# Patient Record
Sex: Male | Born: 1980 | Race: White | Hispanic: Yes | Smoking: Light tobacco smoker
Health system: Southern US, Community
[De-identification: ages and names within clinical notes are randomized; demographics above are authoritative.]

---

## 2017-07-20 ENCOUNTER — Ambulatory Visit (INDEPENDENT_AMBULATORY_CARE_PROVIDER_SITE_OTHER): Payer: 59 | Admitting: Psychology

## 2017-07-20 DIAGNOSIS — F4481 Dissociative identity disorder: Secondary | ICD-10-CM

## 2017-08-07 ENCOUNTER — Ambulatory Visit (INDEPENDENT_AMBULATORY_CARE_PROVIDER_SITE_OTHER): Payer: 59 | Admitting: Psychology

## 2017-08-07 DIAGNOSIS — F449 Dissociative and conversion disorder, unspecified: Secondary | ICD-10-CM | POA: Diagnosis not present

## 2017-09-04 ENCOUNTER — Ambulatory Visit: Payer: 59 | Admitting: Psychology

## 2017-09-09 ENCOUNTER — Ambulatory Visit (INDEPENDENT_AMBULATORY_CARE_PROVIDER_SITE_OTHER): Payer: 59 | Admitting: Psychology

## 2017-09-09 DIAGNOSIS — F449 Dissociative and conversion disorder, unspecified: Secondary | ICD-10-CM

## 2017-09-22 ENCOUNTER — Ambulatory Visit: Payer: Self-pay | Admitting: Psychology

## 2020-03-22 ENCOUNTER — Other Ambulatory Visit: Payer: Self-pay

## 2020-03-22 ENCOUNTER — Emergency Department (HOSPITAL_BASED_OUTPATIENT_CLINIC_OR_DEPARTMENT_OTHER)
Admission: EM | Admit: 2020-03-22 | Discharge: 2020-03-22 | Disposition: A | Discharge status: Home / Self Care | Payer: 59 | Attending: Emergency Medicine | Admitting: Emergency Medicine

## 2020-03-22 ENCOUNTER — Encounter (HOSPITAL_BASED_OUTPATIENT_CLINIC_OR_DEPARTMENT_OTHER): Payer: Self-pay | Admitting: Emergency Medicine

## 2020-03-22 DIAGNOSIS — Z720 Tobacco use: Secondary | ICD-10-CM | POA: Diagnosis not present

## 2020-03-22 DIAGNOSIS — Y990 Civilian activity done for income or pay: Secondary | ICD-10-CM | POA: Insufficient documentation

## 2020-03-22 DIAGNOSIS — S0502XA Injury of conjunctiva and corneal abrasion without foreign body, left eye, initial encounter: Secondary | ICD-10-CM | POA: Insufficient documentation

## 2020-03-22 DIAGNOSIS — Y929 Unspecified place or not applicable: Secondary | ICD-10-CM | POA: Diagnosis not present

## 2020-03-22 DIAGNOSIS — X58XXXA Exposure to other specified factors, initial encounter: Secondary | ICD-10-CM | POA: Diagnosis not present

## 2020-03-22 DIAGNOSIS — Y939 Activity, unspecified: Secondary | ICD-10-CM | POA: Diagnosis not present

## 2020-03-22 DIAGNOSIS — S0592XA Unspecified injury of left eye and orbit, initial encounter: Secondary | ICD-10-CM | POA: Diagnosis present

## 2020-03-22 MED ORDER — CIPROFLOXACIN HCL 0.3 % OP SOLN
2.0000 [drp] | OPHTHALMIC | 0 refills | Status: DC
Start: 2020-03-22 — End: 2020-06-07

## 2020-03-22 MED ORDER — TETRACAINE HCL 0.5 % OP SOLN
2.0000 [drp] | Freq: Once | OPHTHALMIC | Status: AC
  Administered 2020-03-22: 2 [drp] via OPHTHALMIC

## 2020-03-22 MED ORDER — FLUORESCEIN SODIUM 1 MG OP STRP
ORAL_STRIP | OPHTHALMIC | Status: AC
  Administered 2020-03-22: 1 via OPHTHALMIC
  Filled 2020-03-22: qty 1

## 2020-03-22 MED ORDER — TETANUS-DIPHTH-ACELL PERTUSSIS 5-2.5-18.5 LF-MCG/0.5 IM SUSP
INTRAMUSCULAR | Status: AC
  Filled 2020-03-22: qty 0.5

## 2020-03-22 MED ORDER — TETRACAINE HCL 0.5 % OP SOLN
OPHTHALMIC | Status: AC
  Filled 2020-03-22: qty 4

## 2020-03-22 MED ORDER — TETANUS-DIPHTH-ACELL PERTUSSIS 5-2.5-18.5 LF-MCG/0.5 IM SUSP
0.5000 mL | Freq: Once | INTRAMUSCULAR | Status: AC
  Administered 2020-03-22: 0.5 mL via INTRAMUSCULAR

## 2020-03-22 MED ORDER — FLUORESCEIN SODIUM 1 MG OP STRP: 1.0000 | ORAL_STRIP | Freq: Once | OPHTHALMIC | Status: AC

## 2020-03-22 NOTE — ED Provider Notes (Signed)
[facility] EMERGENCY DEPARTMENT Provider Note   CSN: [id number] Arrival date & time: 03/22/20  0137     History Chief Complaint  Patient presents with  . Eye Pain    Gregory Carrillo is a 39 y.o. male.  The history is provided by the patient.  Eye Pain This is a new problem. The current episode started 12 to 24 hours ago. The problem occurs constantly. The problem has not changed since onset.Pertinent negatives include no chest pain, no abdominal pain and no shortness of breath. Nothing aggravates the symptoms. Nothing relieves the symptoms. He has tried nothing for the symptoms. The treatment provided no relief.  Felt like something flew in the L eye 24 hours ago at work.  Now red and irritated.       History reviewed. No pertinent past medical history.  There are no problems to display for this patient.   History reviewed. No pertinent surgical history.     History reviewed. No pertinent family history.  Social History   Tobacco Use  . Smoking status: Light Tobacco Smoker  . Smokeless tobacco: Current User    Types: Chew  Substance Use Topics  . Alcohol use: Not Currently  . Drug use: Not Currently    Home Medications Prior to Admission medications   Not on File    Allergies    Patient has no known allergies.  Review of Systems   Review of Systems  Constitutional: Negative for fever.  HENT: Negative for congestion.   Eyes: Positive for pain and redness. Negative for visual disturbance.  Respiratory: Negative for shortness of breath.   Cardiovascular: Negative for chest pain.  Gastrointestinal: Negative for abdominal pain.  Genitourinary: Negative for difficulty urinating.  Musculoskeletal: Negative for arthralgias.  Neurological: Negative for dizziness.  Psychiatric/Behavioral: Negative for agitation.  All other systems reviewed and are negative.   Physical Exam Updated Vital Signs BP (!) 147/101 (BP Location: Left Arm)   Pulse 81    Temp 98.1 F (36.7 C) (Oral)   Resp 18   Ht 5\' 11"  (1.803 m)   Wt 95.3 kg   SpO2 99%   BMI 29.29 kg/m   Physical Exam Vitals and nursing note reviewed.  Constitutional:      General: He is not in acute distress.    Appearance: Normal appearance.  HENT:     Head: Normocephalic and atraumatic.     Nose: Nose normal.  Eyes:     General: Lids are normal. Lids are everted, no foreign bodies appreciated.     Extraocular Movements: Extraocular movements intact.     Conjunctiva/sclera:     Left eye: Left conjunctiva is injected.     Pupils: Pupils are equal, round, and reactive to light.   Cardiovascular:     Rate and Rhythm: Normal rate and regular rhythm.     Pulses: Normal pulses.     Heart sounds: Normal heart sounds.  Pulmonary:     Effort: Pulmonary effort is normal.     Breath sounds: Normal breath sounds.  Abdominal:     General: Abdomen is flat. Bowel sounds are normal.     Tenderness: There is no abdominal tenderness. There is no guarding.  Musculoskeletal:        General: Normal range of motion.     Cervical back: Normal range of motion and neck supple.  Skin:    General: Skin is warm and dry.     Capillary Refill: Capillary refill takes less than 2  seconds.  Neurological:     General: No focal deficit present.     Mental Status: He is alert and oriented to person, place, and time.     Deep Tendon Reflexes: Reflexes normal.  Psychiatric:        Mood and Affect: Mood normal.        Behavior: Behavior normal.     ED Results / Procedures / Treatments   Labs (all labs ordered are listed, but only abnormal results are displayed) Labs Reviewed - No data to display  EKG None  Radiology No results found.  Procedures Procedures (including critical care time)  Medications Ordered in ED Medications  fluorescein ophthalmic strip 1 strip (has no administration in time range)  tetracaine (PONTOCAINE) 0.5 % ophthalmic solution 2 drop (has no administration in  time range)  Tdap (BOOSTRIX) injection 0.5 mL (has no administration in time range)    ED Course  I have reviewed the triage vital signs and the nursing notes.  Pertinent labs & imaging results that were available during my care of the patient were reviewed by me and considered in my medical decision making (see chart for details).    Patient has a corneal abrasion.  No foreign bodies. Will start ciloxan drops for corneal abrasion and have patient follow up with Ophthalmology in 48 hours.   Final Clinical Impression(s) / ED Diagnoses Return for intractable cough, coughing up blood,fevers >100.4 unrelieved by medication, shortness of breath, intractable vomiting, chest pain, shortness of breath, weakness,numbness, changes in speech, facial asymmetry,abdominal pain, passing out,Inability to tolerate liquids or food, cough, altered mental status or any concerns. No signs of systemic illness or infection. The patient is nontoxic-appearing on exam and vital signs are within normal limits.   I have reviewed the triage vital signs and the nursing notes. Pertinent labs &imaging results that were available during my care of the patient were reviewed by me and considered in my medical decision making (see chart for details).After history, exam, and medical workup I feel the patient has beenappropriately medically screened and is safe for discharge home. Pertinent diagnoses were discussed with the patient. Patient was given return precautions.   Jisselle Poth, MD 03/22/20 [id number]

## 2020-03-22 NOTE — ED Triage Notes (Signed)
Patient presents with complaints of left eye pain and swelling; states woke up yesterday am with left eye pain. Denies any drainage from eye.

## 2020-06-07 ENCOUNTER — Emergency Department (HOSPITAL_BASED_OUTPATIENT_CLINIC_OR_DEPARTMENT_OTHER): Payer: Worker's Compensation

## 2020-06-07 ENCOUNTER — Encounter (HOSPITAL_BASED_OUTPATIENT_CLINIC_OR_DEPARTMENT_OTHER): Payer: Self-pay | Admitting: Emergency Medicine

## 2020-06-07 ENCOUNTER — Other Ambulatory Visit: Payer: Self-pay

## 2020-06-07 ENCOUNTER — Emergency Department (HOSPITAL_BASED_OUTPATIENT_CLINIC_OR_DEPARTMENT_OTHER)
Admission: EM | Admit: 2020-06-07 | Discharge: 2020-06-07 | Disposition: A | Discharge status: Home / Self Care | Payer: Worker's Compensation | Attending: Emergency Medicine | Admitting: Emergency Medicine

## 2020-06-07 DIAGNOSIS — Y9269 Other specified industrial and construction area as the place of occurrence of the external cause: Secondary | ICD-10-CM | POA: Diagnosis not present

## 2020-06-07 DIAGNOSIS — S9782XA Crushing injury of left foot, initial encounter: Secondary | ICD-10-CM | POA: Insufficient documentation

## 2020-06-07 DIAGNOSIS — F17200 Nicotine dependence, unspecified, uncomplicated: Secondary | ICD-10-CM | POA: Diagnosis not present

## 2020-06-07 DIAGNOSIS — Y990 Civilian activity done for income or pay: Secondary | ICD-10-CM | POA: Insufficient documentation

## 2020-06-07 DIAGNOSIS — Y9319 Activity, other involving water and watercraft: Secondary | ICD-10-CM | POA: Insufficient documentation

## 2020-06-07 DIAGNOSIS — S9702XA Crushing injury of left ankle, initial encounter: Secondary | ICD-10-CM | POA: Diagnosis not present

## 2020-06-07 DIAGNOSIS — S99922A Unspecified injury of left foot, initial encounter: Secondary | ICD-10-CM | POA: Diagnosis present

## 2020-06-07 MED ORDER — NAPROXEN 250 MG PO TABS
500.0000 mg | ORAL_TABLET | Freq: Once | ORAL | Status: AC
  Administered 2020-06-07: 500 mg via ORAL
  Filled 2020-06-07: qty 2

## 2020-06-07 NOTE — ED Provider Notes (Signed)
[facility] Provider Note: Lowella Dell, MD, FACEP  CSN: [id number] MRN: [id number] ARRIVAL: 06/07/20 at 0343 ROOM: [facility]06/[facility]   CHIEF COMPLAINT  Foot Injury   HISTORY OF PRESENT ILLNESS  06/07/20 3:54 AM Gregory Carrillo is a 39 y.o. male who had his left foot and ankle run over by a forklift at work about 30 minutes prior to arrival.  He is having pain in his left ankle, primarily in the calcaneus.  He rates his pain is a 5 out of 10, worse with weightbearing.  He states the pain is getting worse.  He is also having ecchymosis of the left ankle and foot.   History reviewed. No pertinent past medical history.  History reviewed. No pertinent surgical history.  No family history on file.  Social History   Tobacco Use  . Smoking status: Light Tobacco Smoker  . Smokeless tobacco: Current User    Types: Chew  Substance Use Topics  . Alcohol use: Not Currently  . Drug use: Not Currently    Prior to Admission medications   Not on File    Allergies Patient has no known allergies.   REVIEW OF SYSTEMS  Negative except as noted here or in the History of Present Illness.   PHYSICAL EXAMINATION  Initial Vital Signs Blood pressure (!) 155/96, pulse 71, temperature 97.8 F (36.6 C), temperature source Oral, resp. rate 16, height 5\' 11"  (1.803 m), weight 88.5 kg, SpO2 100 %.  Examination General: Well-developed, well-nourished male in no acute distress; appearance consistent with age of record HENT: normocephalic; atraumatic Eyes: Normal appearance Neck: supple Heart: regular rate and rhythm Lungs: clear to auscultation bilaterally Abdomen: soft; nondistended; nontender; bowel sounds present Extremities: No deformity; pulses normal; tenderness of left talocalcaneal region with ecchymosis of the left ankle and foot, left foot distally neurovascularly intact:      Neurologic: Awake, alert and oriented; motor function intact in all extremities and symmetric;  no facial droop Skin: Warm and dry Psychiatric: Normal mood and affect   RESULTS  Summary of this visit's results, reviewed and interpreted by myself:   EKG Interpretation  Date/Time:    Ventricular Rate:    PR Interval:    QRS Duration:   QT Interval:    QTC Calculation:   R Axis:     Text Interpretation:        Laboratory Studies: No results found for this or any previous visit (from the past 24 hour(s)). Imaging Studies: DG Ankle Complete Left  Result Date: 06/07/2020 CLINICAL DATA:  Work injury with ankle bruising on the left EXAM: LEFT ANKLE COMPLETE - 3+ VIEW COMPARISON:  None. FINDINGS: There is no evidence of fracture, dislocation, or joint effusion. There is no evidence of arthropathy or other focal bone abnormality. Soft tissues are unremarkable. IMPRESSION: Negative. Electronically Signed   By: 06/09/2020 M.D.   On: 06/07/2020 04:12   DG Foot Complete Left  Result Date: 06/07/2020 CLINICAL DATA:  Crush injury to the foot. EXAM: LEFT FOOT - COMPLETE 3+ VIEW COMPARISON:  None. FINDINGS: There is no evidence of fracture or dislocation. Fragmented appearance of the lateral great toe sesamoid but corticated appearing. There is likely superimposed spurring. IMPRESSION: No acute fracture. Electronically Signed   By: 06/09/2020 M.D.   On: 06/07/2020 04:14    ED COURSE and MDM  Nursing notes, initial and subsequent vitals signs, including pulse oximetry, reviewed and interpreted by myself.  Vitals:   06/07/20 0350  BP: (!) 155/96  Pulse: 71  Resp: 16  Temp: 97.8 F (36.6 C)  TempSrc: Oral  SpO2: 100%  Weight: 88.5 kg  Height: 5\' 11"  (1.803 m)   Medications  naproxen (NAPROSYN) tablet 500 mg (has no administration in time range)    No evidence of fracture or compartment syndrome on radiograph or exam, respectively.  Patient was advised to take over-the-counter naproxen or ibuprofen as needed for pain and to return for worsening symptoms such as severe  pain, pallor or paresthesias.  PROCEDURES  Procedures   ED DIAGNOSES     ICD-10-CM   1. Crushing injury of left foot and ankle, initial encounter  S97.82XA    S97.02XA        Aubreana Cornacchia, , MD 06/07/20 [phone]

## 2020-06-07 NOTE — ED Triage Notes (Signed)
Pt reports fork lift ran over left ankle while at work. Pt has bruising and swelling to left ankle.

## 2021-10-13 IMAGING — DX DG FOOT COMPLETE 3+V*L*
3 series · 3 of 3 positions shown · non-contrast
Comparison: None.

CLINICAL DATA: Crush injury to the foot.

EXAM:
LEFT FOOT - COMPLETE 3+ VIEW

[foot ap]
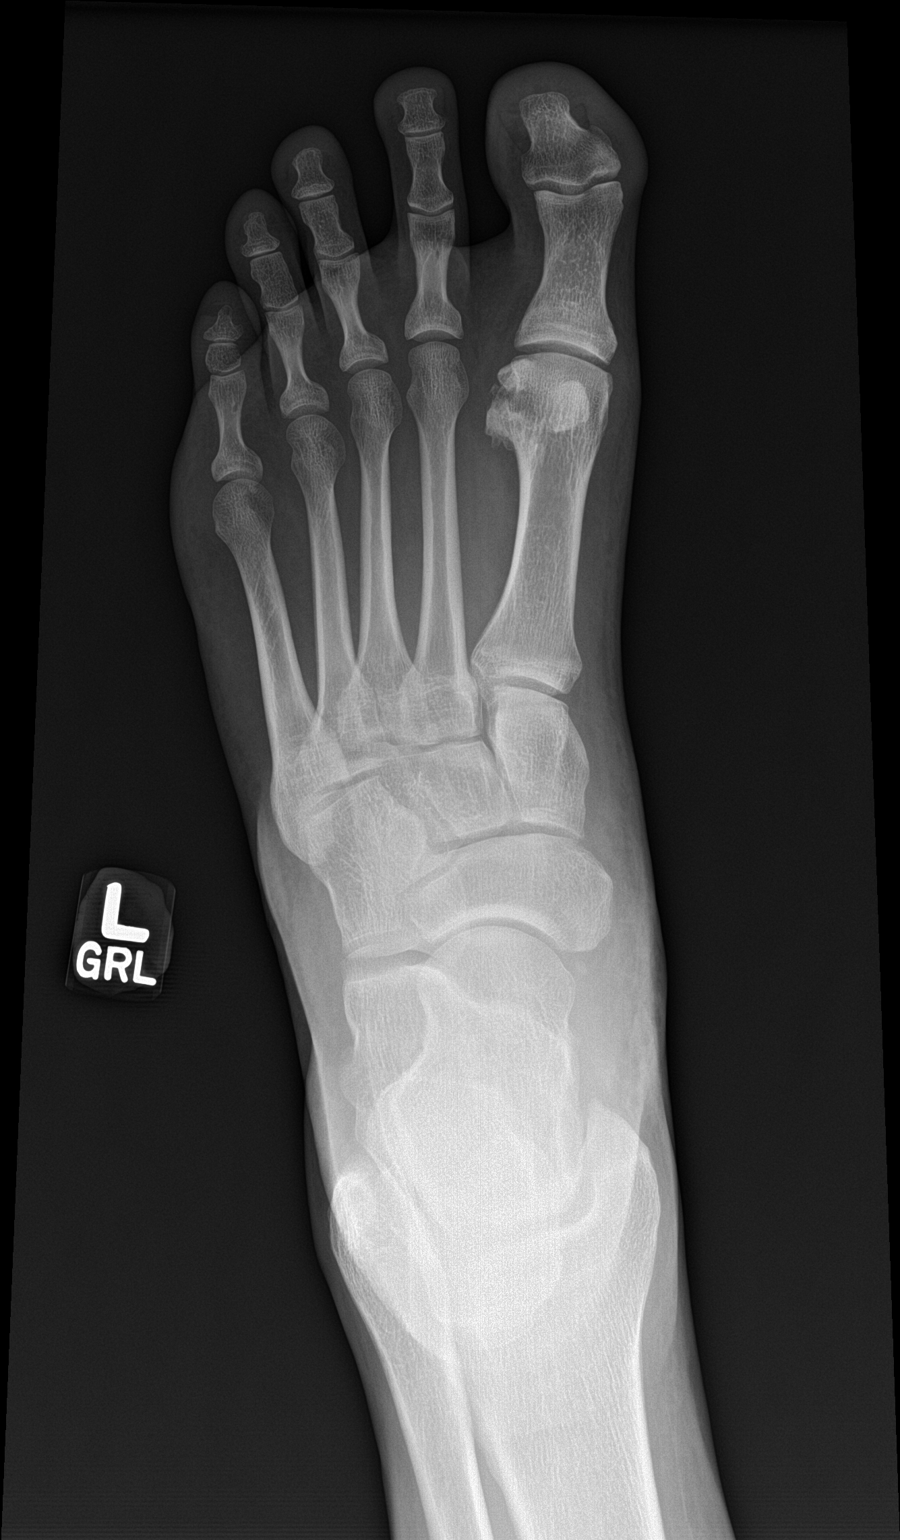

[foot obl]
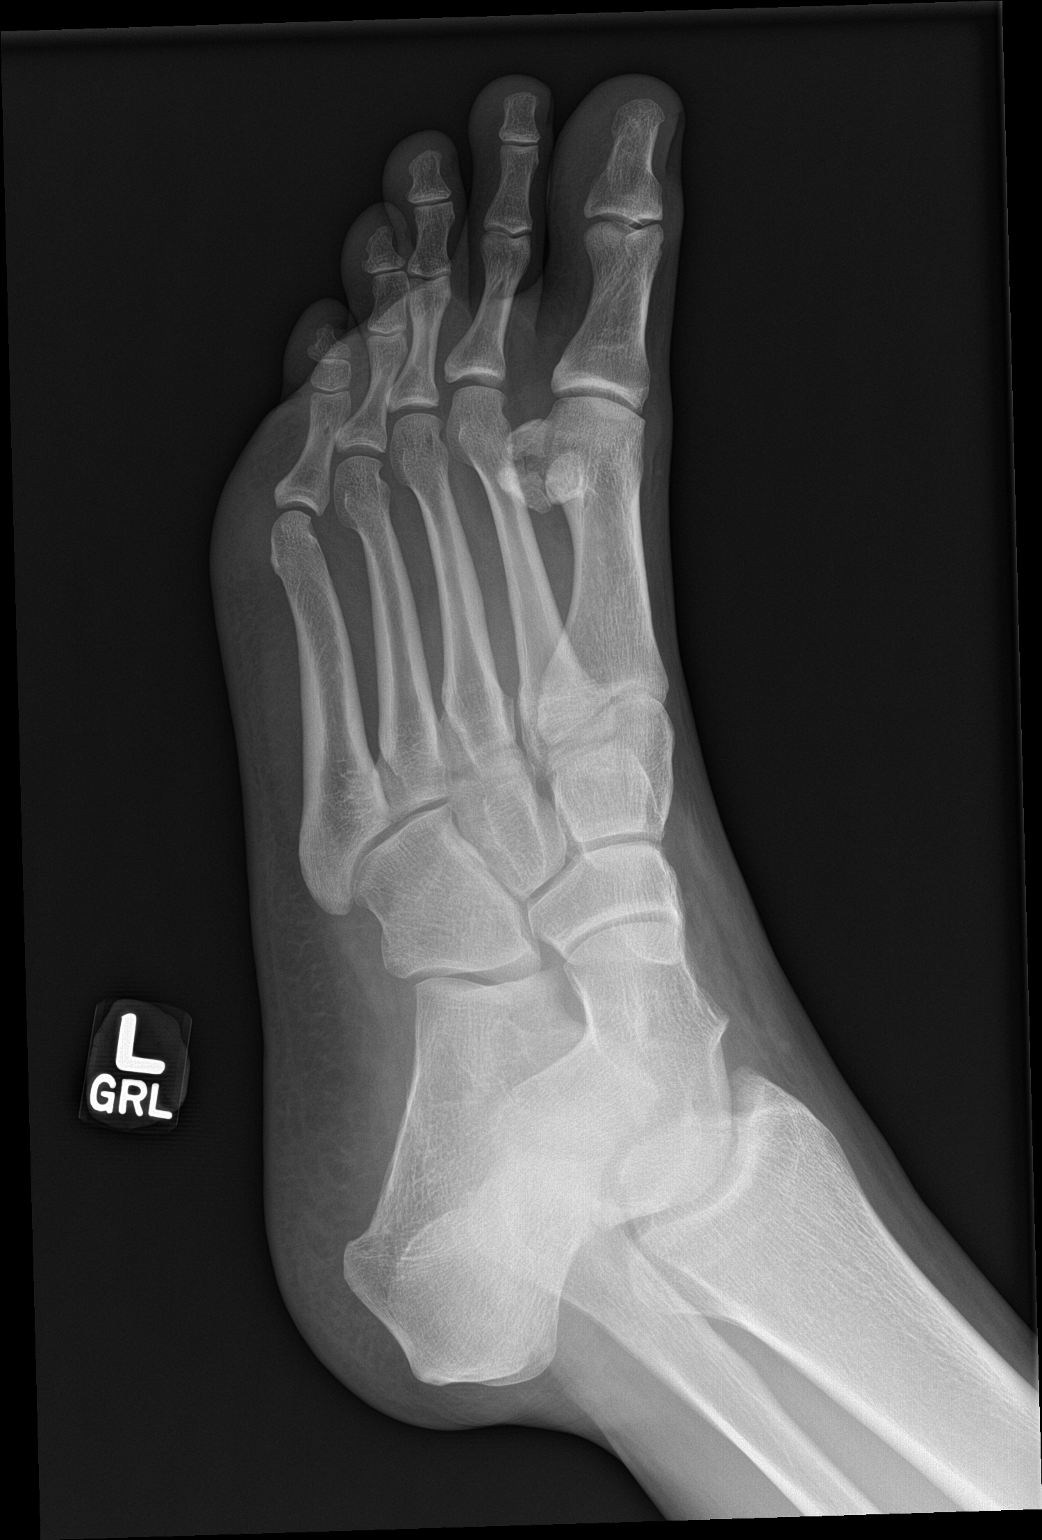

[foot lat]
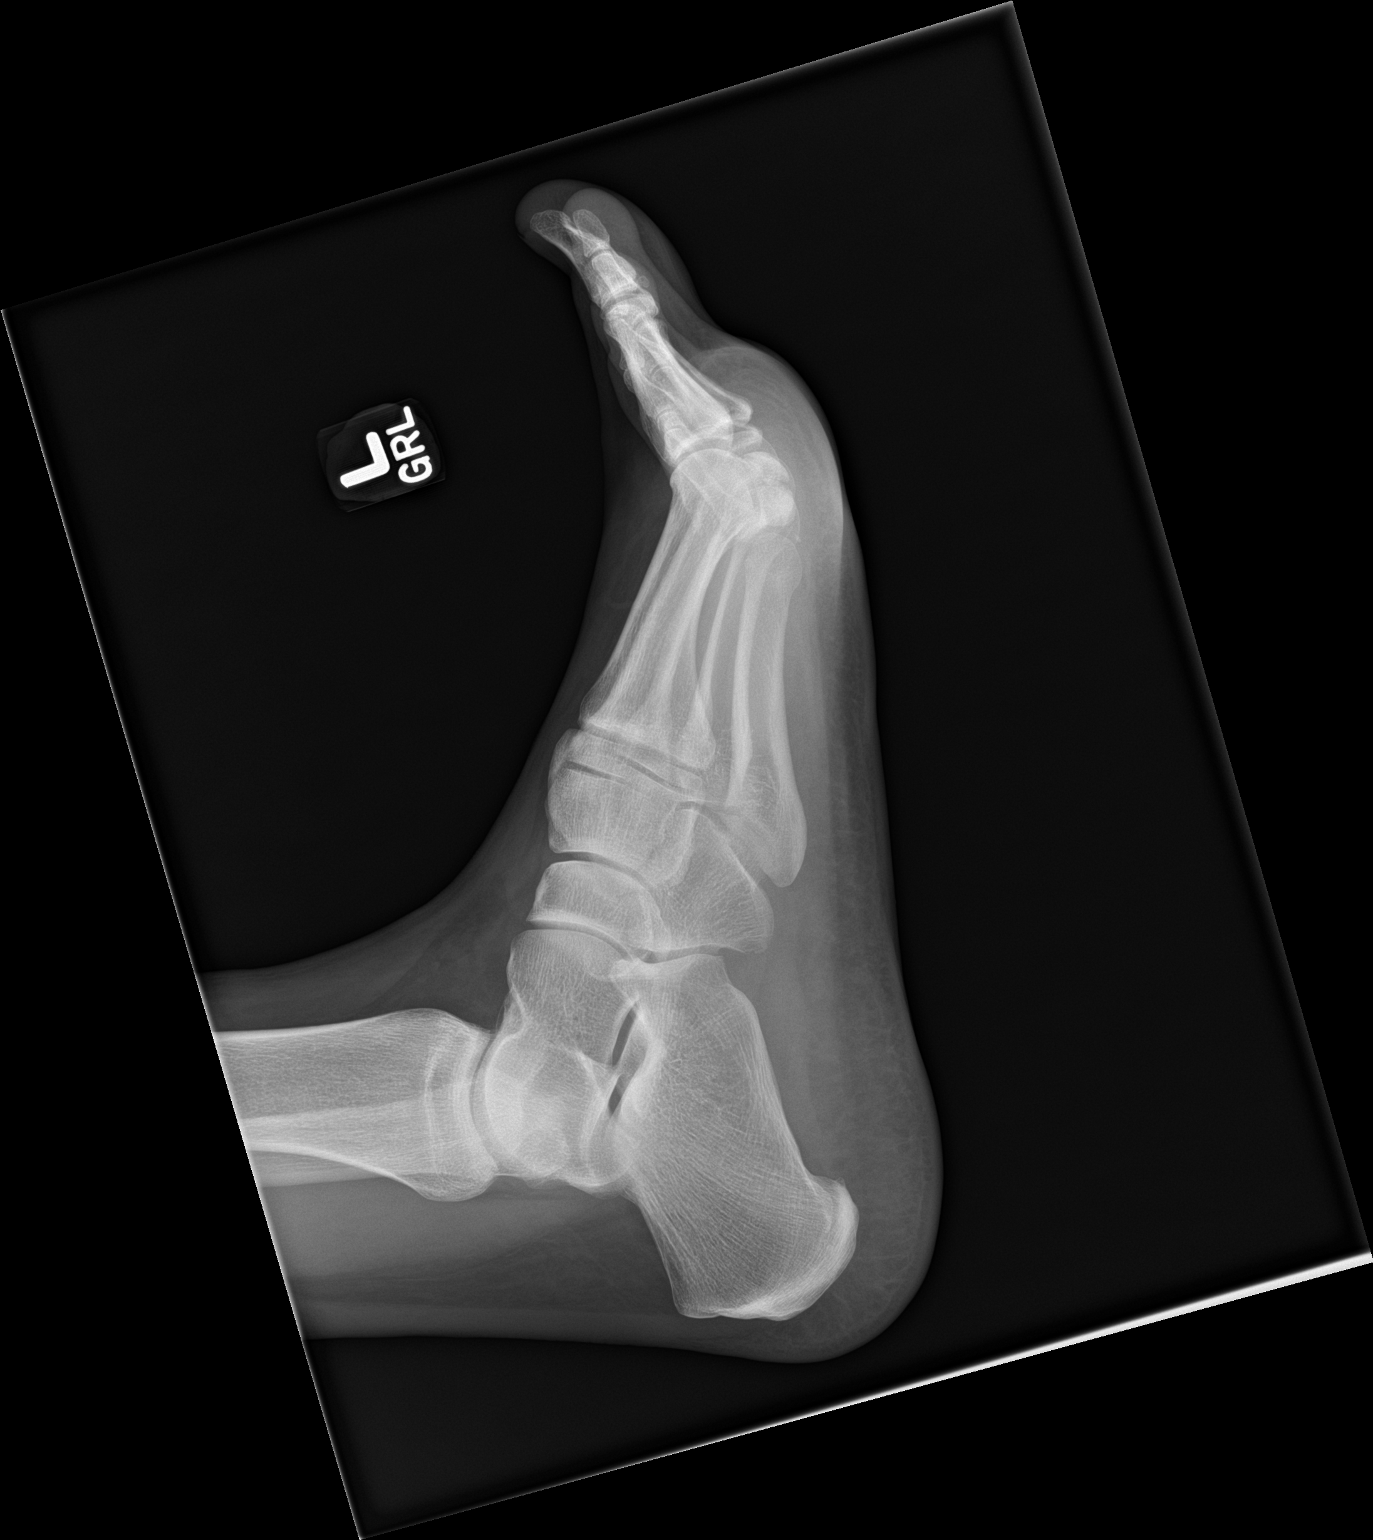

[3 of 3 positions shown; findings below may reference images not displayed]

FINDINGS: There is no evidence of fracture or dislocation.

Fragmented appearance of the lateral great toe sesamoid but
corticated appearing. There is likely superimposed spurring.
IMPRESSION: No acute fracture.

## 2021-10-13 IMAGING — DX DG ANKLE COMPLETE 3+V*L*
3 series · 3 of 3 positions shown · non-contrast
Comparison: None.

CLINICAL DATA: Work injury with ankle bruising on the left

EXAM:
LEFT ANKLE COMPLETE - 3+ VIEW

[ankle ap]
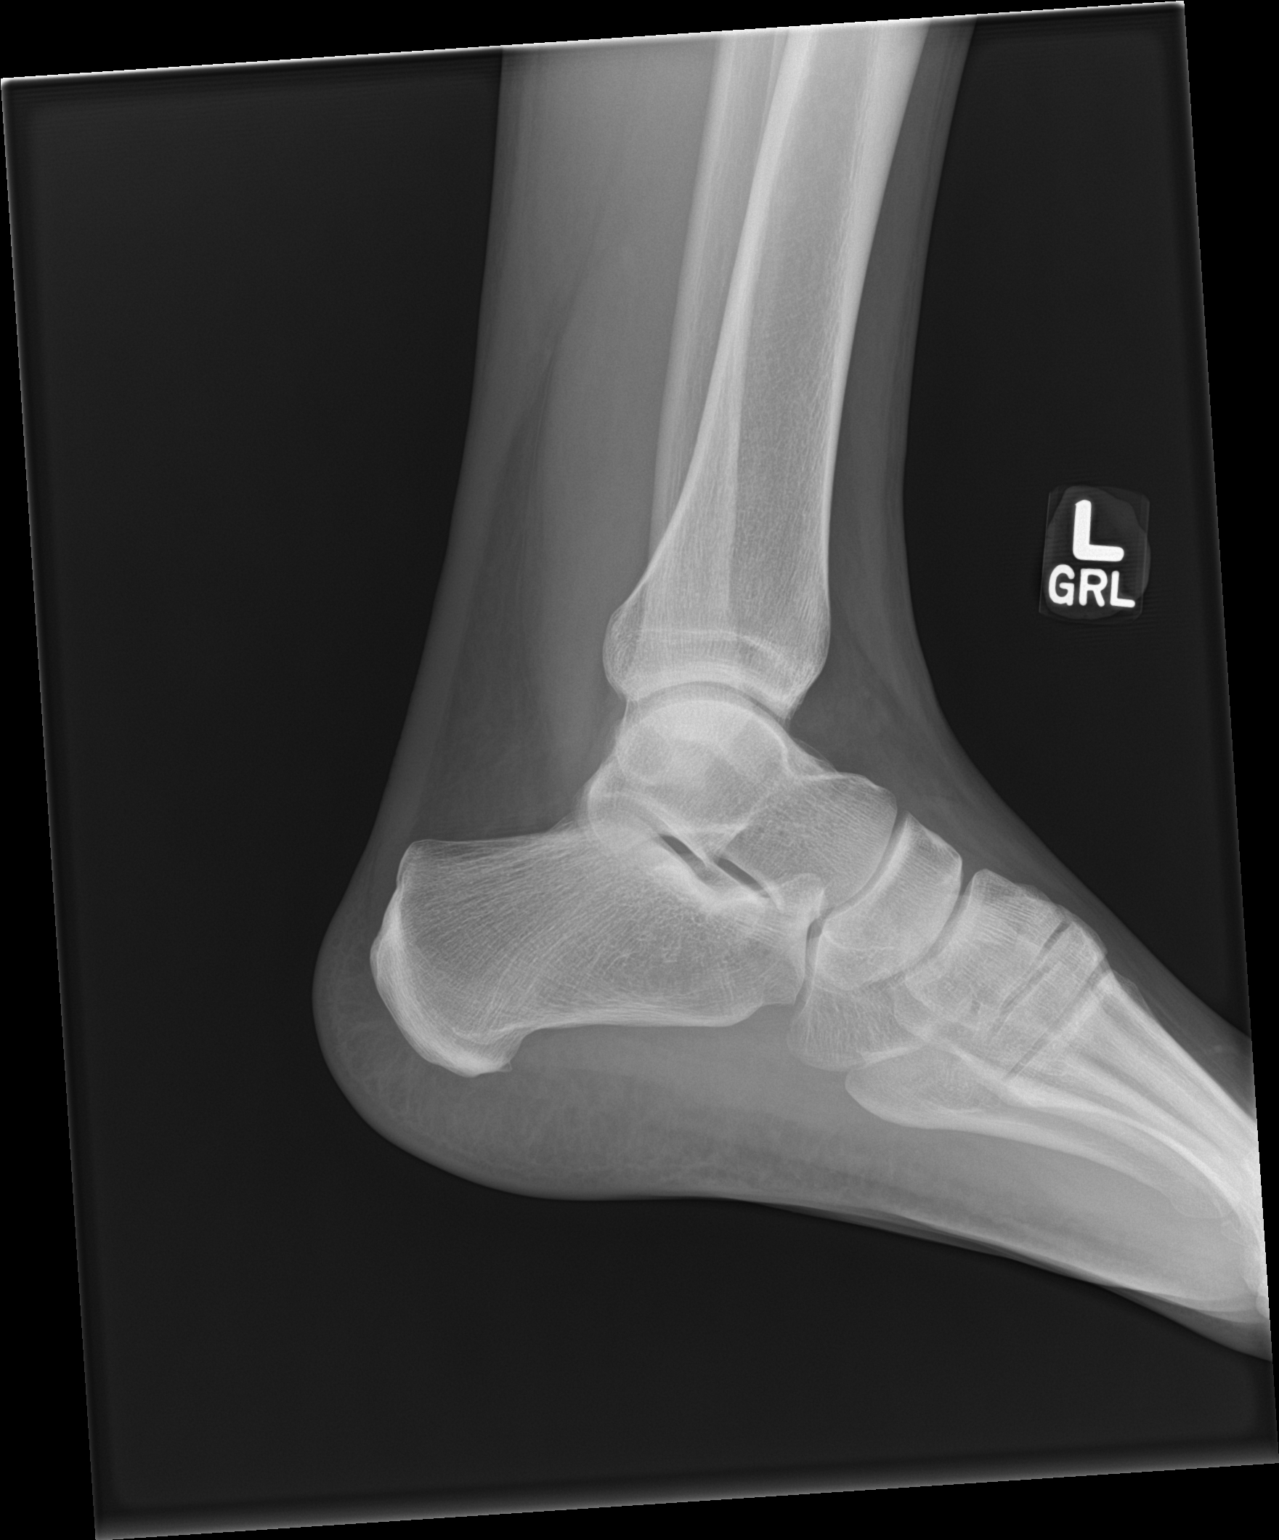

[ankle obl]
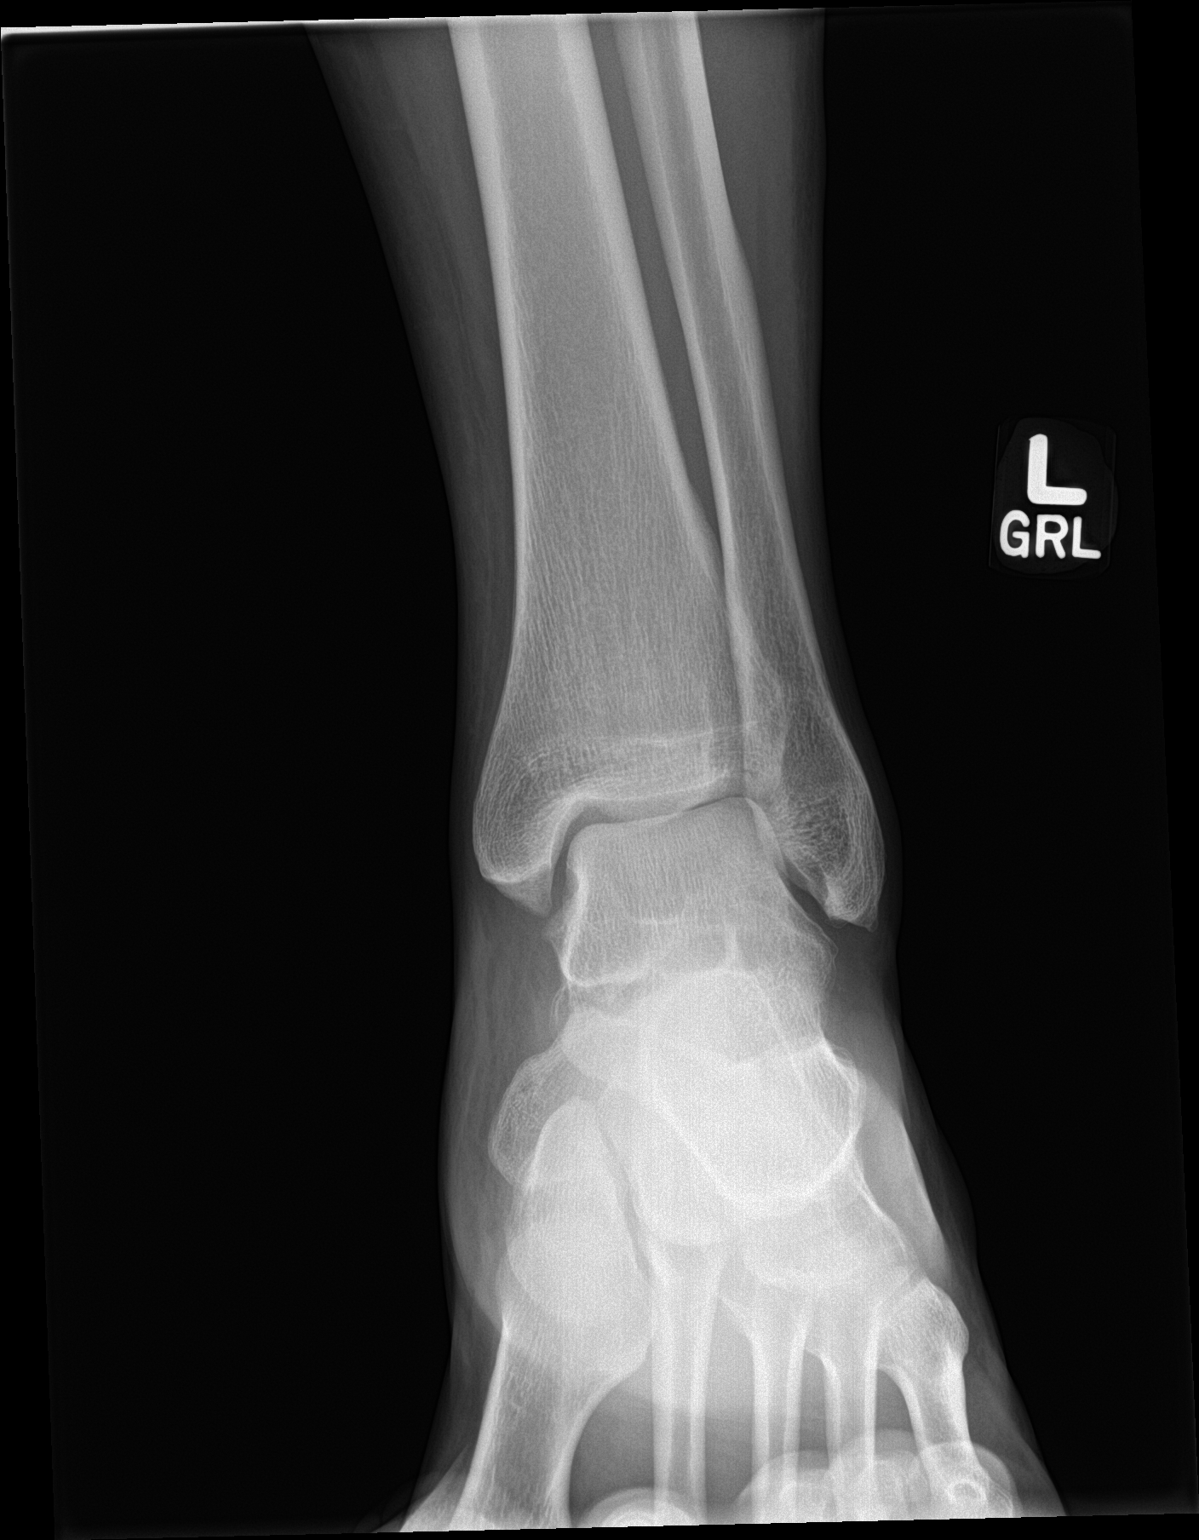

[ankle lat]
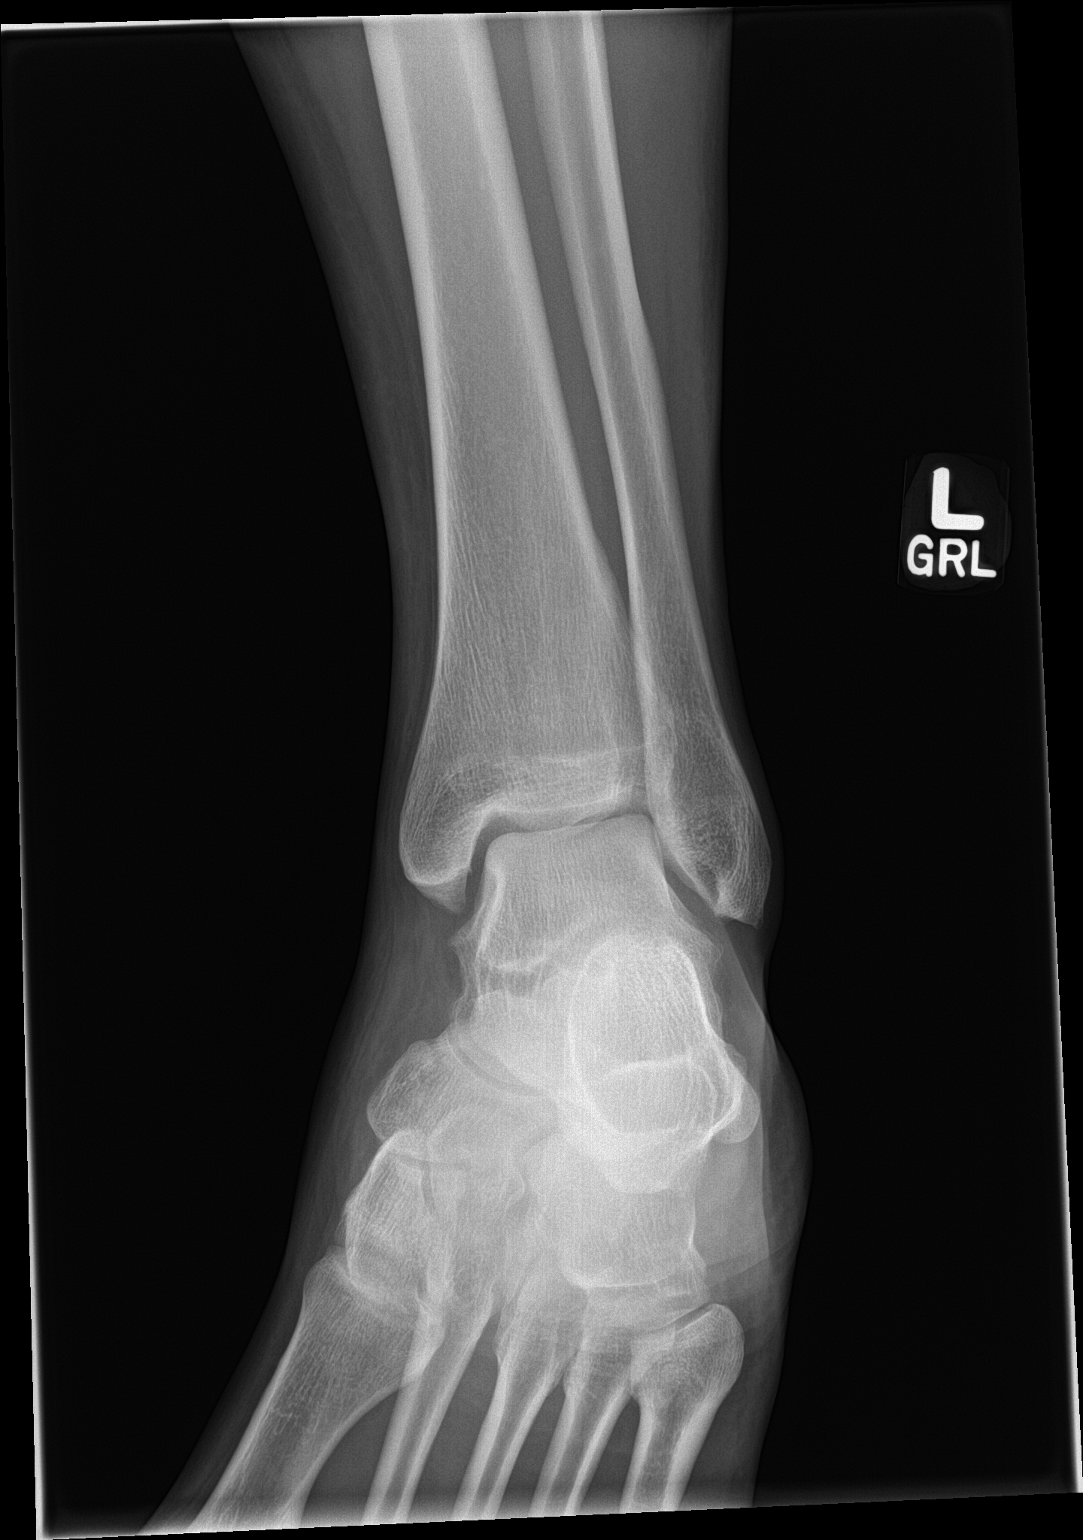

[3 of 3 positions shown; findings below may reference images not displayed]

FINDINGS: There is no evidence of fracture, dislocation, or joint effusion.
There is no evidence of arthropathy or other focal bone abnormality.
Soft tissues are unremarkable.
IMPRESSION: Negative.

## 2022-11-08 ENCOUNTER — Emergency Department (HOSPITAL_COMMUNITY): Payer: 59

## 2022-11-08 ENCOUNTER — Encounter (HOSPITAL_COMMUNITY): Payer: Self-pay

## 2022-11-08 ENCOUNTER — Inpatient Hospital Stay (HOSPITAL_COMMUNITY)
Admission: EM | Admit: 2022-11-08 | Discharge: 2022-11-10 | DRG: 603 | Disposition: A | Discharge status: Home / Self Care | Payer: 59 | Attending: Internal Medicine | Admitting: Internal Medicine

## 2022-11-08 ENCOUNTER — Other Ambulatory Visit: Payer: Self-pay

## 2022-11-08 DIAGNOSIS — L02413 Cutaneous abscess of right upper limb: Principal | ICD-10-CM | POA: Diagnosis present

## 2022-11-08 DIAGNOSIS — L03113 Cellulitis of right upper limb: Principal | ICD-10-CM

## 2022-11-08 DIAGNOSIS — B9562 Methicillin resistant Staphylococcus aureus infection as the cause of diseases classified elsewhere: Secondary | ICD-10-CM | POA: Diagnosis present

## 2022-11-08 DIAGNOSIS — M7989 Other specified soft tissue disorders: Secondary | ICD-10-CM | POA: Diagnosis not present

## 2022-11-08 DIAGNOSIS — I10 Essential (primary) hypertension: Secondary | ICD-10-CM | POA: Diagnosis present

## 2022-11-08 DIAGNOSIS — F17210 Nicotine dependence, cigarettes, uncomplicated: Secondary | ICD-10-CM | POA: Diagnosis present

## 2022-11-08 LAB — BASIC METABOLIC PANEL
Anion gap: 9 (ref 5–15)
BUN: 13 mg/dL (ref 6–20)
CO2: 22 mmol/L (ref 22–32)
Calcium: 8.8 mg/dL — ABNORMAL LOW (ref 8.9–10.3)
Chloride: 108 mmol/L (ref 98–111)
Creatinine, Ser: 1.01 mg/dL (ref 0.61–1.24)
GFR, Estimated: >60 mL/min (ref >60)
Glucose, Bld: 99 mg/dL (ref 70–99)
Potassium: 3.9 mmol/L (ref 3.5–5.1)
Sodium: 139 mmol/L (ref 135–145)

## 2022-11-08 LAB — HEPATIC FUNCTION PANEL
ALT: 20 U/L (ref 0–44)
AST: 17 U/L (ref 15–41)
Albumin: 3.5 g/dL (ref 3.5–5.0)
Alkaline Phosphatase: 58 U/L (ref 38–126)
Bilirubin, Direct: <0.1 mg/dL (ref 0.0–0.2)
Total Bilirubin: 0.4 mg/dL (ref 0.3–1.2)
Total Protein: 6.9 g/dL (ref 6.5–8.1)

## 2022-11-08 LAB — CBC
HCT: 41.7 % (ref 39.0–52.0)
Hemoglobin: 14.4 g/dL (ref 13.0–17.0)
MCH: 30.8 pg (ref 26.0–34.0)
MCHC: 34.5 g/dL (ref 30.0–36.0)
MCV: 89.1 fL (ref 80.0–100.0)
Platelets: 295 10*3/uL (ref 150–400)
RBC: 4.68 MIL/uL (ref 4.22–5.81)
RDW: 11.9 % (ref 11.5–15.5)
WBC: 12.9 10*3/uL — ABNORMAL HIGH (ref 4.0–10.5)
nRBC: 0 % (ref 0.0–0.2)

## 2022-11-08 LAB — PROTIME-INR
INR: 1 (ref 0.8–1.2)
Prothrombin Time: 13.5 seconds (ref 11.4–15.2)

## 2022-11-08 LAB — CK: Total CK: 89 U/L (ref 49–397)

## 2022-11-08 LAB — LACTIC ACID, PLASMA: Lactic Acid, Venous: 0.7 mmol/L (ref 0.5–1.9)

## 2022-11-08 LAB — C-REACTIVE PROTEIN: CRP: 11.3 mg/dL — ABNORMAL HIGH (ref <1.0)

## 2022-11-08 LAB — SEDIMENTATION RATE: Sed Rate: 9 mm/hr (ref 0–16)

## 2022-11-08 MED ORDER — LOSARTAN POTASSIUM 50 MG PO TABS
50.0000 mg | ORAL_TABLET | Freq: Every day | ORAL | Status: DC
  Administered 2022-11-08: 50 mg via ORAL
  Filled 2022-11-08: qty 2

## 2022-11-08 MED ORDER — LIDOCAINE-EPINEPHRINE (PF) 2 %-1:200000 IJ SOLN
20.0000 mL | Freq: Once | INTRAMUSCULAR | Status: DC
  Filled 2022-11-08: qty 20

## 2022-11-08 MED ORDER — ACETAMINOPHEN 325 MG PO TABS: 650.0000 mg | ORAL_TABLET | Freq: Four times a day (QID) | ORAL | Status: DC | PRN

## 2022-11-08 MED ORDER — ACETAMINOPHEN 650 MG RE SUPP: 650.0000 mg | Freq: Four times a day (QID) | RECTAL | Status: DC | PRN

## 2022-11-08 MED ORDER — HYDROCODONE-ACETAMINOPHEN 5-325 MG PO TABS
1.0000 | ORAL_TABLET | ORAL | Status: DC | PRN
  Administered 2022-11-08 – 2022-11-10 (×6): 2 via ORAL
  Filled 2022-11-08 (×6): qty 2

## 2022-11-08 MED ORDER — SODIUM CHLORIDE 0.9 % IV SOLN: INTRAVENOUS | Status: AC

## 2022-11-08 MED ORDER — LIDOCAINE HCL 2 % IJ SOLN
20.0000 mL | Freq: Once | INTRAMUSCULAR | Status: AC
  Administered 2022-11-08: 400 mg
  Filled 2022-11-08: qty 20

## 2022-11-08 MED ORDER — NICOTINE 21 MG/24HR TD PT24
21.0000 mg | MEDICATED_PATCH | Freq: Every day | TRANSDERMAL | Status: DC
  Administered 2022-11-08 – 2022-11-10 (×3): 21 mg via TRANSDERMAL
  Filled 2022-11-08 (×3): qty 1

## 2022-11-08 MED ORDER — VANCOMYCIN HCL IN DEXTROSE 1-5 GM/200ML-% IV SOLN
1000.0000 mg | Freq: Once | INTRAVENOUS | Status: DC
  Filled 2022-11-08 (×2): qty 200

## 2022-11-08 MED ORDER — VANCOMYCIN HCL 1500 MG/300ML IV SOLN
1500.0000 mg | Freq: Two times a day (BID) | INTRAVENOUS | Status: DC
  Administered 2022-11-08 – 2022-11-10 (×4): 1500 mg via INTRAVENOUS
  Filled 2022-11-08 (×5): qty 300

## 2022-11-08 NOTE — Progress Notes (Signed)
Pharmacy Antibiotic Note  Gregory Carrillo is a 41 y.o. male admitted on 11/08/2022 with cellulitis.  Pharmacy has been consulted for Vancomycin  dosing.  Pt presented to urgent care  after bite on elbow. Was given Bactrim. However, did not improve and swelling and redness worsened. 7 day history of worsening right elbow erythema, edema, drainage despite being on oral antibiotics.   Wt not entered on this visit in ED. However, available weight from outpt visit on 11/05/22 lists weight as 90.7 kg and height as 70 inches.   Plan: Vancomycin 1000 mg IV x1 given in ED then Vancomycin 1500 mg IV q12h. ( AUC 518, SCr 1.01. wt 90.7 kg)  Monitor clinical course, renal function, cultures as available      Temp (24hrs), Avg:98.5 F (36.9 C), Min:98.5 F (36.9 C), Max:98.5 F (36.9 C)  Recent Labs  Lab 11/08/22 1255  WBC 12.9*  CREATININE 1.01  LATICACIDVEN 0.7    CrCl cannot be calculated (Unknown ideal weight.).    No Known Allergies  Antimicrobials this admission: 12/30 vancomycin >>     Dose adjustments this admission:   Microbiology results: 12/30 BCx:  12/30 Abscess culture:   Thank you for allowing pharmacy to be a part of this patient's care.   Adalberto Cole, PharmD, BCPS 11/08/2022 4:04 PM

## 2022-11-08 NOTE — Plan of Care (Signed)

## 2022-11-08 NOTE — ED Provider Notes (Signed)
[facility]-EMERGENCY DEPT Provider Note   CSN: [id number] Arrival date & time: 11/08/22  1139     History  Chief Complaint  Patient presents with   Arm Swelling    Gregory Carrillo is a 41 y.o. male with noncontributory past medical history who presents with concern for right arm swelling, redness around the elbow, patient reports he woke up on Sunday with apparent bite on right arm, began swelling, had some redness.  Patient seen on Wednesday urgent care given Bactrim, reports its gotten worse since then.  Arm obviously swollen, warm to the touch, red, draining purulent fluid.  Patient has taken 5-6 doses of Bactrim.  HPI     Home Medications Prior to Admission medications   Not on File      Allergies    Patient has no known allergies.    Review of Systems   Review of Systems  All other systems reviewed and are negative.   Physical Exam Updated Vital Signs BP (!) 140/85   Pulse 77   Temp 98.5 F (36.9 C) (Oral)   Resp 18   SpO2 99%  Physical Exam Vitals and nursing note reviewed.  Constitutional:      General: He is not in acute distress.    Appearance: Normal appearance.  HENT:     Head: Normocephalic and atraumatic.  Eyes:     General:        Right eye: No discharge.        Left eye: No discharge.  Cardiovascular:     Rate and Rhythm: Normal rate and regular rhythm.     Heart sounds: No murmur heard.    No friction rub. No gallop.  Pulmonary:     Effort: Pulmonary effort is normal.     Breath sounds: Normal breath sounds.  Abdominal:     General: Bowel sounds are normal.     Palpations: Abdomen is soft.  Musculoskeletal:     Comments: Intact range of motion of the right elbow, no evidence of joint rigidity compatible with septic arthritis  Skin:    General: Skin is warm and dry.     Capillary Refill: Capillary refill takes less than 2 seconds.     Comments: Tracking red cellulitis from the level of the elbow, with streaking  redness down to the wrist.  Around 3 cm abscess collection just over the lateral right elbow with draining purulent fluid  Neurological:     Mental Status: He is alert and oriented to person, place, and time.  Psychiatric:        Mood and Affect: Mood normal.        Behavior: Behavior normal.     ED Results / Procedures / Treatments   Labs (all labs ordered are listed, but only abnormal results are displayed) Labs Reviewed  CBC - Abnormal; Notable for the following components:      Result Value   WBC 12.9 (*)    All other components within normal limits  BASIC METABOLIC PANEL - Abnormal; Notable for the following components:   Calcium 8.8 (*)    All other components within normal limits  CULTURE, BLOOD (ROUTINE X 2)  CULTURE, BLOOD (ROUTINE X 2)  BODY FLUID CULTURE W GRAM STAIN  LACTIC ACID, PLASMA  LACTIC ACID, PLASMA  HEPATIC FUNCTION PANEL  RAPID URINE DRUG SCREEN, HOSP PERFORMED    EKG None  Radiology DG Elbow Complete Right  Result Date: 11/08/2022 CLINICAL DATA:  Cellulitis EXAM: RIGHT ELBOW -  COMPLETE 3+ VIEW COMPARISON:  None Available. FINDINGS: There is no evidence of fracture, dislocation, or joint effusion. No bone erosion or periosteal elevation. Prominent soft tissue swelling at the posterior and medial aspects of the elbow as well as the proximal forearm and distal upper arm. No soft tissue gas. Tiny 1-2 mm density within the posteromedial soft tissues of the proximal forearm may represent a nonspecific subcutaneous calcification. Small radiopaque foreign body not excluded. IMPRESSION: 1. Prominent soft tissue swelling at the posterior and medial aspects of the elbow as well as the proximal forearm and distal upper arm. No soft tissue gas. 2. Tiny 1-2 mm density within the posteromedial soft tissues of the proximal forearm may represent a nonspecific subcutaneous calcification. Small radiopaque foreign body not excluded. 3. No evidence to suggest septic arthritis or  osteomyelitis of the right elbow. Electronically Signed   By: Duanne Guess D.O.   On: 11/08/2022 13:42    Procedures .Marland KitchenIncision and Drainage  Date/Time: 11/08/2022 1:09 PM  Performed by: Olene Floss, PA-C Authorized by: Olene Floss, PA-C   Consent:    Consent obtained:  Verbal   Consent given by:  Patient   Risks, benefits, and alternatives were discussed: yes     Risks discussed:  Bleeding, incomplete drainage, infection and pain   Alternatives discussed:  No treatment Universal protocol:    Procedure explained and questions answered to patient or proxy's satisfaction: yes     Patient identity confirmed:  Verbally with patient Location:    Type:  Abscess   Size:  3cm   Location:  Upper extremity   Upper extremity location:  Arm   Arm location:  R lower arm Pre-procedure details:    Skin preparation:  Povidone-iodine Sedation:    Sedation type:  None Anesthesia:    Anesthesia method:  Local infiltration   Local anesthetic:  Lidocaine 2% WITH epi Procedure type:    Complexity:  Simple Procedure details:    Incision types:  Single straight   Wound management:  Probed and deloculated   Drainage:  Bloody and purulent   Drainage amount:  Moderate   Wound treatment:  Wound left open   Packing materials:  None Post-procedure details:    Procedure completion:  Tolerated     Medications Ordered in ED Medications  vancomycin (VANCOCIN) IVPB 1000 mg/200 mL premix ( Intravenous Restarted 11/08/22 1359)  lidocaine (XYLOCAINE) 2 % (with pres) injection 400 mg (400 mg Infiltration Given by Other 11/08/22 1303)    ED Course/ Medical Decision Making/ A&P                           Medical Decision Making Amount and/or Complexity of Data Reviewed Labs: ordered. Radiology: ordered.  Risk Prescription drug management.   This patient is a 41 y.o. male  who presents to the ED for concern of arm pain, infection, failing antibiotics.   Differential  diagnoses prior to evaluation: The emergent differential diagnosis includes, but is not limited to,  cellulitis, abscess, developing sepsis .   This is not an exhaustive differential.   Past Medical History / Co-morbidities: Non contributory, no hx of IV drug use  Additional history: Chart reviewed. Pertinent results include: Reviewed outpatient evaluation for bug bite, and cellulitis from 12/27 at [facility]  Physical Exam: Physical exam performed. The pertinent findings include: With tracking cellulitis from the elbow all the way to wrist, streaky redness, he also has a  3 cm draining fluid collection at the level of the elbow which was incised and drained as discussed above  Lab Tests/Imaging studies: I personally interpreted labs/imaging and the pertinent results include: Patient with some leukocytosis to 12.9 despite being on antibiotics, BMP overall unremarkable, lactic acid x 1 is negative, blood cultures in process, as well as I obtained a fluid culture from the abscess. I agree with the radiologist interpretation.   Medications: I ordered medication including vancomycin for antibiotic escalation of outpatient failure of antibiotics.  I have reviewed the patients home medicines and have made adjustments as needed.   Disposition: After consideration of the diagnostic results and the patients response to treatment, I feel that benefit from hospital admission for IV antibiotics after failing outpatient Bactrim with streaky red cellulitis of the right lower arm, as well as abscess which was drained as described above, I consulted with the hospitalist, spoke with Dr. Artis Flock who agrees to admission for this patient after discussion of lab work, and findings.   emergency department workup does not suggest an emergent condition requiring admission or immediate intervention beyond what has been performed at this time. The plan is: as above. The patient is safe for discharge and has been  instructed to return immediately for worsening symptoms, change in symptoms or any other concerns.  Final Clinical Impression(s) / ED Diagnoses Final diagnoses:  Cellulitis of right upper extremity    Rx / DC Orders ED Discharge Orders     None         Olene Floss, PA-C 11/08/22 1430    Margarita Grizzle, MD 11/09/22 [phone]

## 2022-11-08 NOTE — Plan of Care (Signed)
Plan of care reviewed and discussed. Problem: Education: Goal: Knowledge of General Education information will improve Description: Including pain rating scale, medication(s)/side effects and non-pharmacologic comfort measures Outcome: Progressing

## 2022-11-08 NOTE — Consult Note (Signed)
Orthopaedic Surgery Hand and Upper Extremity History and Physical Examination 11/08/2022  Referring Provider: No referring provider defined for this encounter.  CC: Right elbow cellulitis  HPI: Gregory Carrillo is a 41 y.o. male who presents with 1 week of worsening right arm cellulitis.  He reports waking up last weekend with some pain and swelling on dorsal forearm near elbow.  He does not recall any injuries or insect bites.  The swelling was primarily about 3-4 cm distal to the olecranon tip.  Since that time, his forearm and arm have developed worsening redness.  He saw a medical provider on Thursday and was started on Bactrim but his symptoms have worsened.  He was evaluated in the ED and the primary swelling was lanced although there was only a small amount of purulence.     Past Medical History: History reviewed. No pertinent past medical history.   Medications: Scheduled Meds:  losartan  50 mg Oral Daily   Continuous Infusions:  sodium chloride 75 mL/hr at 11/08/22 1557   vancomycin Stopped (11/08/22 1500)   vancomycin     PRN Meds:.acetaminophen **OR** acetaminophen, HYDROcodone-acetaminophen  Allergies: Allergies as of 11/08/2022   (No Known Allergies)    Past Surgical History: History reviewed. No pertinent surgical history.   Social History: Social History   Occupational History   Not on file  Tobacco Use   Smoking status: Light Smoker   Smokeless tobacco: Current    Types: Chew  Substance and Sexual Activity   Alcohol use: Not Currently   Drug use: Not Currently   Sexual activity: Not on file     Family History: History reviewed. No pertinent family history. Otherwise, no relevant orthopaedic family history  ROS: Review of Systems: All systems reviewed and are negative except that mentioned in HPI  Work/Sport/Hobbies: See HPI  Physical Examination: Vitals:   11/08/22 1430 11/08/22 1610  BP: 130/69   Pulse: 68   Resp: 18   Temp:  98.4 F  (36.9 C)  SpO2: 97%    Constitutional: Awake, alert.  WN/WD Appearance: healthy, no acute distress, well-groomed Affect: Normal HEENT: EOMI, mucous membranes moist CV: RRR Pulm: breathing comfortably  right Upper Extremity / Hand Erythema and induration of the posterior arm around the elbow extending proximally.  There is a 2cm diameter raised nodule with a 1cm incision in the center, that is slightly oozing blood.  No purulence.  Skin over this feels indurated.  Able to range elbow joint 0 deg extension to 130 deg flexion.  Sensation intact in radial, ulnar, and median distributions.  Motor intact in AIN, PIN, and ulnar distributions.  Fingers warm and well perfused.    Pertinent Labs: n/a  Imaging: I have personally reviewed the following studies: Imaging of right elbow reviewed and demonstrates soft tissue swelling on posterior elbow.  Additional Studies: n/a  Assessment/Plan: Cellulitis of right upper extremity  Given the wound has been lanced and the swelling appears to be focal induration, the patient's primary issue is his cellulitis.  I recommend IV abx for this.  No concern for septic arthritis of the elbow.  For the lanced area, you could also consider performing TID dial soap soaks or PT performing hydrotherapy.  Please feel free to reach out with any questions or concerns or re-accumulation of the abscess.  Patient can follow up with me in approximately 7-10 days after discharge if needed.   Dial soap soaks  Supplies needed: -Liquid Dial Antibacterial soap -clean basin large enough to submerge  wound (to be rinsed and dried between uses) -clean dry towel -Sterile gauze -comfortable tape or self adhesive wrap  Perform 2-3 times per day: Soak wound in warm soapy water for 10-15 minutes in clean basin Gently pat dry Cover with fresh sterile gauze and loosely wrap or cover with comfortable bandage.   Shaune Pollack, MD Hand and Upper Extremity Surgery [facility]  [facility] [place] [phone] 11/08/2022 4:39 PM

## 2022-11-08 NOTE — Assessment & Plan Note (Addendum)
41 year old male presenting with 7 day history of worsening right elbow erythema, edema, drainage despite being on oral antibiotics -obs to med-surg -failed outpatient antibiotics and wound is large possibly requiring surgical debridement and concern for edema of forearm.  -does not appear to involve joint, check inflammatory markers, radial pulses intact, check CK -consulted general surgery and recommended hand surgery. Hand Surgery saw patient. No surgical intervention at this time. Recommended continued IV abx, PT hydrotherapy and to reach back out if clinical course changes.  -xray with no gas or OM findings -failed bactrim, continue vancomycin  -blood cultures pending -no SIRS/sepsis criteria. Mild leukocytosis -lactic acid wnl  -pain mild

## 2022-11-08 NOTE — ED Notes (Signed)
Bed not cleaned yet  

## 2022-11-08 NOTE — Assessment & Plan Note (Signed)
Blood pressure decently controlled, decrease dose of losartan down to 50mg  daily

## 2022-11-08 NOTE — H&P (Signed)
History and Physical    Patient: Gregory Carrillo XUX:[id number] DOB: Feb 14, 1981 DOA: 11/08/2022 DOS: the patient was seen and examined on 11/08/2022 PCP: Patient, No Pcp Per  Patient coming from: Home - lives alone.    Chief Complaint: right arm swelling and redness   HPI: Gregory Carrillo is a 41 y.o. male with medical history significant for HTN who presented to ED with concerns of worsening swelling and redness to his right arm. He states Sunday night he put his arm down on the table and it was sore and he noticed a little place on his right elbow that was red and small.  He didn't really think twice about it.  Monday it was much more tender and swollen. He went to urgent care on Wednesday and was given bactrim. He states the redness and swelling continued to get worse and it would burst open and drain thick/grey liquid. He went back to UC today and they sent him to ED.  He has had no fevers.  He has had no N/V. He denies any trauma to the elbow or open cut. He thought maybe a bite was there.   UTD on his tdap: 03/22/2020   Denies any fever/chills, vision changes/headaches, chest pain or palpitations, shortness of breath or cough, abdominal pain, N/V/D, dysuria or leg swelling.    He smokes 10-15 cigarettes/day. He does not drink alcohol daily  ER Course:  vitals: afebrile, bp: 132/80, HR: 92, RR:18, oxygen: 99%RA Pertinent labs: wbc: 12.9, BC obtained, lactic acid pending Right elbow xray: Prominent soft tissue swelling at the posterior and medial aspects of the elbow as well as the proximal forearm and distal upper arm. No soft tissue gas. 2. Tiny 1-2 mm density within the posteromedial soft tissues of the proximal forearm may represent a nonspecific subcutaneous calcification. Small radiopaque foreign body not excluded. 3. No evidence to suggest septic arthritis or osteomyelitis of the right elbow In ED: Abscess drained in ED. started on vancomycin. [facility] asked to admit.    Review of  Systems: As mentioned in the history of present illness. All other systems reviewed and are negative. History reviewed. No pertinent past medical history. History reviewed. No pertinent surgical history. Social History:  reports that he has been smoking. His smokeless tobacco use includes chew. He reports that he does not currently use alcohol. He reports that he does not currently use drugs.  No Known Allergies  History reviewed. No pertinent family history.  Prior to Admission medications   Not on File    Physical Exam: Vitals:   11/08/22 1207 11/08/22 1418 11/08/22 1430  BP: 132/80 (!) 140/85 130/69  Pulse: 92 77 68  Resp: 18 18 18   Temp: 98.5 F (36.9 C)    TempSrc: Oral    SpO2: 99% 99% 97%   General:  Appears calm and comfortable and is in NAD Eyes:  PERRL, EOMI, normal lids, iris ENT:  grossly normal hearing, lips & tongue, mmm; appropriate dentition Neck:  no LAD, masses or thyromegaly; no carotid bruits Cardiovascular:  RRR, no m/r/g. No LE edema.  Respiratory:   CTA bilaterally with no wheezes/rales/rhonchi.  Normal respiratory effort. Abdomen:  soft, NT, ND, NABS Back:   normal alignment, no CVAT Skin:  right elbow: edema/erythema and warmth from mid forearm to distal humerus. Pitting in forearm. Abscess with clear drainage, indurated. Full rom intact. Radial/ulnar pulses intact.   Musculoskeletal:  grossly normal tone BUE/BLE, good ROM, no bony abnormality Lower extremity:  No LE edema.  Limited foot exam with no ulcerations.  2+ distal pulses. Psychiatric:  grossly normal mood and affect, speech fluent and appropriate, AOx3 Neurologic:  CN 2-12 grossly intact, moves all extremities in coordinated fashion, sensation intact   Radiological Exams on Admission: Independently reviewed - see discussion in A/P where applicable  DG Elbow Complete Right  Result Date: 11/08/2022 CLINICAL DATA:  Cellulitis EXAM: RIGHT ELBOW - COMPLETE 3+ VIEW COMPARISON:  None  Available. FINDINGS: There is no evidence of fracture, dislocation, or joint effusion. No bone erosion or periosteal elevation. Prominent soft tissue swelling at the posterior and medial aspects of the elbow as well as the proximal forearm and distal upper arm. No soft tissue gas. Tiny 1-2 mm density within the posteromedial soft tissues of the proximal forearm may represent a nonspecific subcutaneous calcification. Small radiopaque foreign body not excluded. IMPRESSION: 1. Prominent soft tissue swelling at the posterior and medial aspects of the elbow as well as the proximal forearm and distal upper arm. No soft tissue gas. 2. Tiny 1-2 mm density within the posteromedial soft tissues of the proximal forearm may represent a nonspecific subcutaneous calcification. Small radiopaque foreign body not excluded. 3. No evidence to suggest septic arthritis or osteomyelitis of the right elbow. Electronically Signed   By: Duanne Guess D.O.   On: 11/08/2022 13:42     Labs on Admission: I have personally reviewed the available labs and imaging studies at the time of the admission.  Pertinent labs:   WBC: 12.9   Assessment and Plan: Principal Problem:   Abscess of right elbow Active Problems:   Benign essential HTN    Assessment and Plan: * Abscess of right elbow 41 year old male presenting with 7 day history of worsening right elbow erythema, edema, drainage despite being on oral antibiotics -obs to med-surg -failed outpatient antibiotics and wound is large possibly requiring surgical debridement and concern for edema of forearm.  -does not appear to involve joint, check inflammatory markers, radial pulses intact, check CK -consulted general surgery and recommended hand surgery. Hand Surgery saw patient. No surgical intervention at this time. Recommended continued IV abx, PT hydrotherapy and to reach back out if clinical course changes.  -xray with no gas or OM findings -failed bactrim, continue  vancomycin  -blood cultures pending -no SIRS/sepsis criteria. Mild leukocytosis -lactic acid wnl  -pain mild   Benign essential HTN Blood pressure decently controlled, decrease dose of losartan down to 50mg  daily     Advance Care Planning:   Code Status: Full Code   Consults: general surgery/hand surgery/PT hydrotherapy   DVT Prophylaxis: ambulation   Family Communication: none   Severity of Illness: The appropriate patient status for this patient is OBSERVATION. Observation status is judged to be reasonable and necessary in order to provide the required intensity of service to ensure the patient's safety. The patient's presenting symptoms, physical exam findings, and initial radiographic and laboratory data in the context of their medical condition is felt to place them at decreased risk for further clinical deterioration. Furthermore, it is anticipated that the patient will be medically stable for discharge from the hospital within 2 midnights of admission.   Author: , MD 11/08/2022 4:07 PM  For on call review [email or web address].

## 2022-11-08 NOTE — ED Triage Notes (Signed)
Pt presents with c/o right arm swelling and redness. Pt reports that on Sunday he woke up with an apparent bite to his right arm and it began swelling and had some redness. Pt seen on Wednesday at [facility] and given antibiotics but it has gotten worse since then. Arm is obviously swollen, warm to the touch, and red.

## 2022-11-08 NOTE — ED Notes (Signed)
Took longer d/t EVS delay in cleaning room

## 2022-11-09 DIAGNOSIS — M7989 Other specified soft tissue disorders: Secondary | ICD-10-CM | POA: Diagnosis present

## 2022-11-09 DIAGNOSIS — B9562 Methicillin resistant Staphylococcus aureus infection as the cause of diseases classified elsewhere: Secondary | ICD-10-CM | POA: Diagnosis present

## 2022-11-09 DIAGNOSIS — L02413 Cutaneous abscess of right upper limb: Secondary | ICD-10-CM | POA: Diagnosis not present

## 2022-11-09 DIAGNOSIS — L03113 Cellulitis of right upper limb: Secondary | ICD-10-CM | POA: Diagnosis present

## 2022-11-09 DIAGNOSIS — I10 Essential (primary) hypertension: Secondary | ICD-10-CM | POA: Diagnosis present

## 2022-11-09 DIAGNOSIS — F17210 Nicotine dependence, cigarettes, uncomplicated: Secondary | ICD-10-CM | POA: Diagnosis present

## 2022-11-09 LAB — BASIC METABOLIC PANEL
Anion gap: 6 (ref 5–15)
BUN: 12 mg/dL (ref 6–20)
CO2: 23 mmol/L (ref 22–32)
Calcium: 8.8 mg/dL — ABNORMAL LOW (ref 8.9–10.3)
Chloride: 109 mmol/L (ref 98–111)
Creatinine, Ser: 0.94 mg/dL (ref 0.61–1.24)
GFR, Estimated: >60 mL/min (ref >60)
Glucose, Bld: 94 mg/dL (ref 70–99)
Potassium: 4.1 mmol/L (ref 3.5–5.1)
Sodium: 138 mmol/L (ref 135–145)

## 2022-11-09 LAB — CBC
HCT: 41.5 % (ref 39.0–52.0)
Hemoglobin: 14.4 g/dL (ref 13.0–17.0)
MCH: 31.1 pg (ref 26.0–34.0)
MCHC: 34.7 g/dL (ref 30.0–36.0)
MCV: 89.6 fL (ref 80.0–100.0)
Platelets: 289 10*3/uL (ref 150–400)
RBC: 4.63 MIL/uL (ref 4.22–5.81)
RDW: 11.8 % (ref 11.5–15.5)
WBC: 12.8 10*3/uL — ABNORMAL HIGH (ref 4.0–10.5)
nRBC: 0 % (ref 0.0–0.2)

## 2022-11-09 LAB — HIV ANTIBODY (ROUTINE TESTING W REFLEX): HIV Screen 4th Generation wRfx: NONREACTIVE

## 2022-11-09 MED ORDER — METHOCARBAMOL 500 MG PO TABS
500.0000 mg | ORAL_TABLET | Freq: Three times a day (TID) | ORAL | Status: DC | PRN
  Administered 2022-11-09: 500 mg via ORAL
  Filled 2022-11-09 (×2): qty 1

## 2022-11-09 MED ORDER — KETOROLAC TROMETHAMINE 15 MG/ML IJ SOLN
15.0000 mg | Freq: Four times a day (QID) | INTRAMUSCULAR | Status: DC
  Administered 2022-11-09 – 2022-11-10 (×6): 15 mg via INTRAVENOUS
  Filled 2022-11-09 (×6): qty 1

## 2022-11-09 NOTE — Progress Notes (Addendum)
PHYSICAL THERAPY WOUND CARE EVALUATION     11/09/22 1000  Subjective Assessment  Subjective "i'm alright. it hurts..."  Patient and Family Stated Goals home soon  Date of Onset  (present on admission)  Prior Treatments s/p lancing at bedside in ED 12/30  Evaluation and Treatment  Evaluation and Treatment Procedures Explained to Patient/Family Yes  Evaluation and Treatment Procedures agreed to  Wound / Incision (Open or Dehisced) 11/08/22 Incision - Open Elbow Posterior;Right I and D (in ED of cellulitis R elbow)  Date First Assessed/Time First Assessed: 11/08/22 1804   Wound Type: Incision - Open  Location: Elbow  Location Orientation: Posterior;Right  Wound Description (Comments): I and D (in ED of cellulitis R elbow)  Present on Admission: Yes  Dressing Type Gauze (Comment) (4x4 gauze, kerlix, tape)  Dressing Changed Changed  Dressing Status Old drainage  Dressing Change Frequency Daily  Site / Wound Assessment Yellow;Red;Painful  % Wound base Red or Granulating 60%  % Wound base Yellow/Fibrinous Exudate 40%  Peri-wound Assessment Erythema (non-blanchable);Edema  Wound Length (cm) 2.1 cm  Wound Width (cm) 3 cm  Wound Surface Area (cm^2) 6.3 cm^2  Margins Unattached edges (unapproximated)  Drainage Amount Minimal  Drainage Description Serosanguineous  Treatment Debridement (Selective);Cleansed  Selective Debridement (non-excisional)  Selective Debridement (non-excisional) - Location R posterior elbow  Selective Debridement (non-excisional) - Tools Used Forceps;Scissors  Selective Debridement (non-excisional) - Tissue Removed yellow slough  Wound Therapy - Assess/Plan/Recommendations  Wound Therapy - Clinical Statement 41 yo male presenting with 7 day history of worsening redness, swelling, pain, draininge of wound on posterior R elbow. Pt is unsure of cause. Diagnosed with cellulitis. S/P bedside lancing by ED MD. Orthopedics consulted and made recommendations. When I arrived in  room, there were no supplies at bedside. There was no dial soap present either. Perfomed cleansing with saline, debrided small amount of yellow slough, rinsed with saline, pat dry, covered with 4x4 gauze, secured with kerlix and tape. Encouraged pt to notify RN if dressing feels to restrictive or falls off due to loosening. Will plan to follow 2x/week. Requested WOCN consult prior to d/c as well. Will defer wound care/dressing changes to nursing on days PT does not see patient.   Factors Delaying/Impairing Wound Healing Infection - systemic/local  Hydrotherapy Plan Debridement;Dressing change;Patient/family education  Wound Therapy - Frequency Other (comment) (2x/week)  Wound Therapy - Current Recommendations WOC nurse;Case manager/social work  Wound Therapy - Follow Up Recommendations f/u selective debridement;dressing changes by RN;dressing changes by family/patient  Wound Therapy Goals - Improve the function of patient's integumentary system by progressing the wound(s) through the phases of wound healing by:  Decrease Necrotic Tissue to 20%  Decrease Necrotic Tissue - Progress Goal set today  Increase Granulation Tissue to 80%  Increase Granulation Tissue - Progress Goal set today  Patient/Family will be able to   (cleanse and change dressing)  Goals/treatment plan/discharge plan were made with and agreed upon by patient/family Yes  Time For Goal Achievement 7 days  Wound Therapy - Potential for Goals Excellent    Faye Ramsay, PT Acute Rehabilitation  Office: [phone]

## 2022-11-09 NOTE — Consult Note (Signed)
WOC Nurse Consult Note: Reason for Consult: Consulted for right elbow infection.  Orthopedic service assessed patient on 11/08/22 and provided detailed topical treatment recommendations for bedside staff.  No further topical guidance needed from [facility] team.   Wound type:infectious Will not follow at this time.  Please re-consult if needed.  Mike Gip MSN, RN, FNP-BC CWON Wound, Ostomy, Continence Nurse Outpatient [facility] [phone] Pager [phone]

## 2022-11-09 NOTE — Plan of Care (Signed)
  Problem: Education: Goal: Knowledge of General Education information will improve Description: Including pain rating scale, medication(s)/side effects and non-pharmacologic comfort measures 11/09/2022 0720 by Beverly Sessions, RN Outcome: Progressing 11/08/2022 1801 by Beverly Sessions, RN Outcome: Progressing   Problem: Activity: Goal: Risk for activity intolerance will decrease Outcome: Progressing   Problem: Clinical Measurements: Goal: Ability to maintain clinical measurements within normal limits will improve 11/09/2022 0720 by Beverly Sessions, RN Outcome: Progressing 11/08/2022 1801 by Beverly Sessions, RN Outcome: Progressing

## 2022-11-09 NOTE — Progress Notes (Signed)
[facility] Progress Note Patient: Gregory Carrillo GEX:[id number] DOB: August 23, 1981 DOA: 11/08/2022  DOS: the patient was seen and examined on 11/09/2022  Brief hospital course: Difficult past medical history present to the hospital with complaints of swelling and redness of the right arm elbow area.  Hand surgery was consulted.  Underwent bedside debridement.  Culture sent. Assessment and Plan: Right arm cellulitis with abscess of the right elbow. Failed outpatient IV antibiotics. Underwent bedside surgical debridement. Hydrotherapy completed. Wound care consult. Currently on IV vancomycin. Monitor.  HTN. Blood pressure stable.  Continue current regimen.  Pain control. Continue current pain control.  Subjective: No nausea no vomiting no fever no chills.  Reports uncontrolled pain.  Physical Exam: General: in moderate distress, No Rash Cardiovascular: S1 and S2 Present, No Murmur Respiratory: Good respiratory effort, Bilateral Air entry present. No Crackles, No wheezes Abdomen: Bowel Sound present, No tenderness Extremities: No edema, improving redness and swelling of the right elbow. Neuro: Alert and oriented x3, no new focal deficit  Data Reviewed: I have Reviewed nursing notes, Vitals, and Lab results. Since last encounter, pertinent lab results CBC and BMP   . I have ordered test including CBC and BMP  .   Disposition: Status is: Inpatient Remains inpatient appropriate because: Need for broad-spectrum IV antibiotics   Family Communication: No one at bedside Level of care: Med-Surg  Vitals:   11/08/22 2224 11/09/22 0202 11/09/22 0613 11/09/22 1356  BP: 121/67 113/61 110/74 (!) 119/59  Pulse: 79 80 81 63  Resp:  14 15 16   Temp: 98.6 F (37 C)  98 F (36.7 C) 97.7 F (36.5 C)  TempSrc: Oral  Oral   SpO2: 99% 96% 97% 99%  Weight:      Height:         Author: , MD 11/09/2022 6:17 PM  Please look on [email or web address] to find out who is on call.

## 2022-11-10 ENCOUNTER — Other Ambulatory Visit (HOSPITAL_COMMUNITY): Payer: Self-pay

## 2022-11-10 DIAGNOSIS — L02413 Cutaneous abscess of right upper limb: Secondary | ICD-10-CM | POA: Diagnosis not present

## 2022-11-10 LAB — BODY FLUID CULTURE W GRAM STAIN

## 2022-11-10 MED ORDER — LINEZOLID 600 MG PO TABS
600.0000 mg | ORAL_TABLET | Freq: Two times a day (BID) | ORAL | Status: DC
  Administered 2022-11-10: 600 mg via ORAL
  Filled 2022-11-10: qty 1

## 2022-11-10 MED ORDER — METHOCARBAMOL 500 MG PO TABS: 500.0000 mg | ORAL_TABLET | Freq: Three times a day (TID) | ORAL | 0 refills | Status: AC | PRN

## 2022-11-10 MED ORDER — NICOTINE 21 MG/24HR TD PT24: 21.0000 mg | MEDICATED_PATCH | Freq: Every day | TRANSDERMAL | 0 refills | Status: AC

## 2022-11-10 MED ORDER — LINEZOLID 600 MG PO TABS: 600.0000 mg | ORAL_TABLET | Freq: Two times a day (BID) | ORAL | 0 refills | Status: AC

## 2022-11-10 MED ORDER — HYDROCODONE-ACETAMINOPHEN 5-325 MG PO TABS: 1.0000 | ORAL_TABLET | ORAL | 0 refills | Status: AC | PRN

## 2022-11-10 MED ORDER — NAPROXEN 500 MG PO TBEC: 500.0000 mg | DELAYED_RELEASE_TABLET | Freq: Three times a day (TID) | ORAL | 0 refills | Status: AC

## 2022-11-10 NOTE — TOC CM/SW Note (Signed)
Transition of Care [facility]) Screening Note  Patient Details  Name: Omri Bertran Date of Birth: 1981-01-07  Transition of Care [facility]) CM/SW Contact:    Sherie Don, LCSW Phone Number: 11/10/2022, 2:34 PM  Transition of Care Department [facility]) has reviewed patient and no TOC needs have been identified at this time. We will continue to monitor patient advancement through interdisciplinary progression rounds. If new patient transition needs arise, please place a TOC consult.

## 2022-11-10 NOTE — TOC Benefit Eligibility Note (Signed)
Patient Gregory Carrillo, English as a foreign language completed.    The patient is currently admitted and upon discharge could be taking linezolid (Zyvox) 600 mg tablets.  The current 5 day co-pay is $61.14 due to a $75.00 deductible.   The patient is insured through [employer] [place] Patient [place] Patient Advocate Team Direct Number: [phone]  Fax: [phone]

## 2022-11-10 NOTE — Plan of Care (Signed)
  Problem: Coping: Goal: Level of anxiety will decrease Outcome: Progressing   Problem: Pain Managment: Goal: General experience of comfort will improve Outcome: Progressing   

## 2022-11-10 NOTE — Plan of Care (Signed)
?  Problem: Education: ?Goal: Knowledge of General Education information will improve ?Description: Including pain rating scale, medication(s)/side effects and non-pharmacologic comfort measures ?Outcome: Progressing ?  ?Problem: Health Behavior/Discharge Planning: ?Goal: Ability to manage health-related needs will improve ?Outcome: Progressing ?  ?Problem: Clinical Measurements: ?Goal: Ability to maintain clinical measurements within normal limits will improve ?Outcome: Progressing ?Goal: Will remain free from infection ?Outcome: Progressing ?Goal: Diagnostic test results will improve ?Outcome: Progressing ?Goal: Respiratory complications will improve ?Outcome: Progressing ?Goal: Cardiovascular complication will be avoided ?Outcome: Progressing ?  ?Problem: Coping: ?Goal: Level of anxiety will decrease ?Outcome: Progressing ?  ?Problem: Elimination: ?Goal: Will not experience complications related to bowel motility ?Outcome: Progressing ?  ?Problem: Pain Managment: ?Goal: General experience of comfort will improve ?Outcome: Progressing ?  ?Problem: Safety: ?Goal: Ability to remain free from injury will improve ?Outcome: Progressing ?  ?Problem: Skin Integrity: ?Goal: Risk for impaired skin integrity will decrease ?Outcome: Progressing ?  ?

## 2022-11-13 LAB — CULTURE, BLOOD (ROUTINE X 2)
Culture: NO GROWTH
Culture: NO GROWTH

## 2022-11-13 NOTE — Discharge Summary (Signed)
Physician Discharge Summary   Patient: Gregory BrookesJoseph Carrillo MRN: [id number] DOB: 03/22/1981  Admit date:     11/08/2022  Discharge date: 11/10/2022  Discharge Physician: Lynden OxfordPranav Avia Merkley  PCP: Patient, No Pcp Per  Recommendations at discharge:  Follow up with PCP in 1 week   Follow-up Information     Chiaramonti, Edsel PetrinAlexander M, MD. Schedule an appointment as soon as possible for a visit in 1 week(s).   Specialties: Orthopedic Surgery, Hand Surgery Contact information: [address] [place] [id number] [phone]                Discharge Diagnoses: Principal Problem:   Abscess of right elbow Active Problems:   Benign essential HTN  Assessment and Plan  Right arm cellulitis with abscess of the right elbow due to MRSA infection Failed outpatient antibiotics. Underwent bedside surgical debridement. Hydrotherapy completed. Wound care recommended by hand surgery Currently on IV vancomycin. Will be on zyvox. Copay cost were discussed with pt and he was agreeable.  Outpt follow up with hand surgery recommended   HTN. Blood pressure stable.  Continue current regimen.   Pain control. Continue current pain control.   Pain control - [employer] [place] Controlled Substance Reporting System database was reviewed. and patient was instructed, not to drive, operate heavy machinery, perform activities at heights, swimming or participation in water activities or provide baby-sitting services while on Pain, Sleep and Anxiety Medications; until their outpatient Physician has advised to do so again. Also recommended to not to take more than prescribed Pain, Sleep and Anxiety Medications.  Consultants:  Hand surgery   Procedures performed:  Bedside debridement  DISCHARGE MEDICATION: Allergies as of 11/10/2022   No Known Allergies      Medication List     STOP taking these medications    ibuprofen 200 MG tablet Commonly known as: ADVIL   losartan 100 MG tablet Commonly known as: COZAAR    NON FORMULARY   sulfamethoxazole-trimethoprim 800-160 MG tablet Commonly known as: BACTRIM DS       TAKE these medications    HYDROcodone-acetaminophen 5-325 MG tablet Commonly known as: NORCO/VICODIN Take 1 tablet by mouth every 4 (four) hours as needed for moderate pain or severe pain.   linezolid 600 MG tablet Commonly known as: ZYVOX Take 1 tablet (600 mg total) by mouth 2 (two) times daily for 7 days.   methocarbamol 500 MG tablet Commonly known as: ROBAXIN Take 1 tablet (500 mg total) by mouth every 8 (eight) hours as needed for muscle spasms.   naproxen 500 MG EC tablet Commonly known as: EC NAPROSYN Take 1 tablet (500 mg total) by mouth 3 (three) times daily with meals for 7 days.   nicotine 21 mg/24hr patch Commonly known as: NICODERM CQ - dosed in mg/24 hours Place 1 patch (21 mg total) onto the skin daily.               Discharge Care Instructions  (From admission, onward)           Start     Ordered   11/10/22 0000  Discharge wound care:       Comments: Supplies needed: -Liquid Dial Antibacterial soap -clean basin large enough to submerge wound (to be rinsed and dried between uses) -clean dry towel -Sterile gauze -comfortable tape or self adhesive wrap   Perform 2 times per day: 1. Soak wound in warm soapy water for 10-15 minutes in clean basin 2. Gently pat dry 3. Cover with fresh sterile gauze and  loosely wrap or cover with comfortable bandage.   11/10/22 1408           Disposition: Home Diet recommendation: Regular diet  Discharge Exam: Vitals:   11/09/22 1356 11/09/22 2023 11/10/22 0602 11/10/22 1423  BP: (!) 119/59 125/72 118/73 126/70  Pulse: 63 70 84 81  Resp: 16 17 18 17   Temp: 97.7 F (36.5 C) 98.1 F (36.7 C) 97.8 F (36.6 C) 98.1 F (36.7 C)  TempSrc:  Oral Oral   SpO2: 99% 98% 97% 100%  Weight:      Height:       General: Appear in mild distress; no visible Abnormal Neck Mass Or lumps, Conjunctiva  normal Cardiovascular: S1 and S2 Present, no Murmur, Respiratory: good respiratory effort, Bilateral Air entry present and CTA, no Crackles, no wheezes Abdomen: Bowel Sound present, Non tender  Extremities: no Pedal edema Neurology: alert and oriented to time, place, and person  Filed Weights   11/08/22 1807  Weight: 90.7 kg   Condition at discharge: stable  The results of significant diagnostics from this hospitalization (including imaging, microbiology, ancillary and laboratory) are listed below for reference.   Imaging Studies: DG Elbow Complete Right  Result Date: 11/08/2022 CLINICAL DATA:  Cellulitis EXAM: RIGHT ELBOW - COMPLETE 3+ VIEW COMPARISON:  None Available. FINDINGS: There is no evidence of fracture, dislocation, or joint effusion. No bone erosion or periosteal elevation. Prominent soft tissue swelling at the posterior and medial aspects of the elbow as well as the proximal forearm and distal upper arm. No soft tissue gas. Tiny 1-2 mm density within the posteromedial soft tissues of the proximal forearm may represent a nonspecific subcutaneous calcification. Small radiopaque foreign body not excluded. IMPRESSION: 1. Prominent soft tissue swelling at the posterior and medial aspects of the elbow as well as the proximal forearm and distal upper arm. No soft tissue gas. 2. Tiny 1-2 mm density within the posteromedial soft tissues of the proximal forearm may represent a nonspecific subcutaneous calcification. Small radiopaque foreign body not excluded. 3. No evidence to suggest septic arthritis or osteomyelitis of the right elbow. Electronically Signed   By: Davina Poke D.O.   On: 11/08/2022 13:42    Microbiology: Results for orders placed or performed during the hospital encounter of 11/08/22  Blood culture (routine x 2)     Status: None   Collection Time: 11/08/22 12:55 PM   Specimen: BLOOD  Result Value Ref Range Status   Specimen Description   Final    BLOOD BLOOD LEFT  HAND Performed at [facility] [address], [place], [place] [address]    Special Requests   Final    BOTTLES DRAWN AEROBIC AND ANAEROBIC Blood Culture results may not be optimal due to an inadequate volume of blood received in culture bottles Performed at [facility] [address], [place], [place] [address]    Culture   Final    NO GROWTH 5 DAYS Performed at [facility], [place] [address] [place], [place] [address]    Report Status 11/13/2022 FINAL  Final  Blood culture (routine x 2)     Status: None   Collection Time: 11/08/22 12:55 PM   Specimen: BLOOD  Result Value Ref Range Status   Specimen Description   Final    BLOOD BLOOD LEFT FOREARM Performed at [facility] [address] [place],  [address]    Special Requests   Final    BOTTLES DRAWN  AEROBIC AND ANAEROBIC Blood Culture results may not be optimal due to an inadequate volume of blood received in culture bottles Performed at [facility], [place] [address] [place], [place] [address]    Culture   Final    NO GROWTH 5 DAYS Performed at [facility], [place] [address], [place], [place] [address]    Report Status 11/13/2022 FINAL  Final  Body fluid culture w Gram Stain     Status: None   Collection Time: 11/08/22 12:59 PM   Specimen: Abscess; Body Fluid  Result Value Ref Range Status   Specimen Description   Final    ABSCESS Performed at [facility] [address], [place], [place] [address]    Special Requests   Final    NONE Performed at [facility], [place] [address] [place], [place] [address]    Gram Stain   Final    MODERATE WBC PRESENT, PREDOMINANTLY PMN NO ORGANISMS SEEN Performed at [facility], [place] [address], [place],  [address]    Culture   Final    ABUNDANT METHICILLIN RESISTANT STAPHYLOCOCCUS AUREUS   Report Status 11/10/2022 FINAL  Final   Organism  ID, Bacteria METHICILLIN RESISTANT STAPHYLOCOCCUS AUREUS  Final      Susceptibility   Methicillin resistant staphylococcus aureus - MIC*    CIPROFLOXACIN >=8 RESISTANT Resistant     ERYTHROMYCIN >=8 RESISTANT Resistant     GENTAMICIN <=0.5 SENSITIVE Sensitive     OXACILLIN >=4 RESISTANT Resistant     TETRACYCLINE >=16 RESISTANT Resistant     VANCOMYCIN 1 SENSITIVE Sensitive     TRIMETH/SULFA >=320 RESISTANT Resistant     CLINDAMYCIN <=0.25 SENSITIVE Sensitive     RIFAMPIN <=0.5 SENSITIVE Sensitive     Inducible Clindamycin NEGATIVE Sensitive     * ABUNDANT METHICILLIN RESISTANT STAPHYLOCOCCUS AUREUS   Labs: CBC: Recent Labs  Lab 11/08/22 1255 11/09/22 0333  WBC 12.9* 12.8*  HGB 14.4 14.4  HCT 41.7 41.5  MCV 89.1 89.6  PLT 295 637   Basic Metabolic Panel: Recent Labs  Lab 11/08/22 1255 11/09/22 0333  NA 139 138  K 3.9 4.1  CL 108 109  CO2 22 23  GLUCOSE 99 94  BUN 13 12  CREATININE 1.01 0.94  CALCIUM 8.8* 8.8*   Liver Function Tests: Recent Labs  Lab 11/08/22 1255  AST 17  ALT 20  ALKPHOS 58  BILITOT 0.4  PROT 6.9  ALBUMIN 3.5   CBG: No results for input(s): "GLUCAP" in the last 168 hours.  Discharge time spent: greater than 30 minutes.  Signed: Berle Mull, MD [facility] 11/10/2022
